# Patient Record
Sex: Male | Born: 1975 | Race: White | Hispanic: No | Marital: Married | Smoking: Never smoker
Health system: Southern US, Community
[De-identification: ages and names within clinical notes are randomized; demographics above are authoritative.]

---

## 2006-05-28 ENCOUNTER — Emergency Department (HOSPITAL_COMMUNITY): Admission: EM | Admit: 2006-05-28 | Discharge: 2006-05-28 | Payer: Self-pay | Admitting: Emergency Medicine
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
## 2013-10-14 ENCOUNTER — Telehealth (INDEPENDENT_AMBULATORY_CARE_PROVIDER_SITE_OTHER): Payer: Self-pay | Admitting: Surgery

## 2013-10-14 ENCOUNTER — Encounter (INDEPENDENT_AMBULATORY_CARE_PROVIDER_SITE_OTHER): Payer: Self-pay | Admitting: Surgery

## 2013-10-14 ENCOUNTER — Ambulatory Visit (INDEPENDENT_AMBULATORY_CARE_PROVIDER_SITE_OTHER): Payer: BC Managed Care – PPO | Admitting: Surgery

## 2013-10-14 VITALS — BP 126/80 | HR 73 | Temp 97.4°F | Ht 72.0 in | Wt 233.6 lb

## 2013-10-14 DIAGNOSIS — K4090 Unilateral inguinal hernia, without obstruction or gangrene, not specified as recurrent: Secondary | ICD-10-CM

## 2013-10-14 DIAGNOSIS — E669 Obesity, unspecified: Secondary | ICD-10-CM

## 2013-10-14 NOTE — Patient Instructions (Signed)
Please consider the recommendations that we have given you today:  Consider surgery to explore groins and fix the hernia in right (& possibly left) groins  See the Handout(s) we have given you.  Please call our office at [phone] if you wish to schedule surgery or if you have further questions / concerns.   Hernia A hernia occurs when an internal organ pushes out through a weak spot in the abdominal wall. Hernias most commonly occur in the groin and around the navel. Hernias often can be pushed back into place (reduced). Most hernias tend to get worse over time. Some abdominal hernias can get stuck in the opening (irreducible or incarcerated hernia) and cannot be reduced. An irreducible abdominal hernia which is tightly squeezed into the opening is at risk for impaired blood supply (strangulated hernia). A strangulated hernia is a medical emergency. Because of the risk for an irreducible or strangulated hernia, surgery may be recommended to repair a hernia. CAUSES   Heavy lifting.  Prolonged coughing.  Straining to have a bowel movement.  A cut (incision) made during an abdominal surgery. HOME CARE INSTRUCTIONS   Bed rest is not required. You may continue your normal activities.  Avoid lifting more than 10 pounds (4.5 kg) or straining.  Cough gently. If you are a smoker it is best to stop. Even the best hernia repair can break down with the continual strain of coughing. Even if you do not have your hernia repaired, a cough will continue to aggravate the problem.  Do not wear anything tight over your hernia. Do not try to keep it in with an outside bandage or truss. These can damage abdominal contents if they are trapped within the hernia sac.  Eat a normal diet.  Avoid constipation. Straining over long periods of time will increase hernia size and encourage breakdown of repairs. If you cannot do this with diet alone, stool softeners may be used. SEEK IMMEDIATE MEDICAL CARE IF:    You have a fever.  You develop increasing abdominal pain.  You feel nauseous or vomit.  Your hernia is stuck outside the abdomen, looks discolored, feels hard, or is tender.  You have any changes in your bowel habits or in the hernia that are unusual for you.  You have increased pain or swelling around the hernia.  You cannot push the hernia back in place by applying gentle pressure while lying down. MAKE SURE YOU:   Understand these instructions.  Will watch your condition.  Will get help right away if you are not doing well or get worse. Document Released: 06/06/2005 Document Revised: 08/29/2011 Document Reviewed: 01/24/2008 [facility] Patient Information 2014 [employer] [place], [place].  HERNIA REPAIR: POST OP INSTRUCTIONS  1. DIET: Follow a light bland diet the first 24 hours after arrival home, such as soup, liquids, crackers, etc.  Be sure to include lots of fluids daily.  Avoid fast food or heavy meals as your are more likely to get nauseated.  Eat a low fat the next few days after surgery. 2. Take your usually prescribed home medications unless otherwise directed. 3. PAIN CONTROL: a. Pain is best controlled by a usual combination of three different methods TOGETHER: i. Ice/Heat ii. Over the counter pain medication iii. Prescription pain medication b. Most patients will experience some swelling and bruising around the hernia(s) such as the bellybutton, groins, or old incisions.  Ice packs or heating pads (30-60 minutes up to 6 times a day) will help. Use ice for the first  few days to help decrease swelling and bruising, then switch to heat to help relax tight/sore spots and speed recovery.  Some people prefer to use ice alone, heat alone, alternating between ice & heat.  Experiment to what works for you.  Swelling and bruising can take several weeks to resolve.   c. It is helpful to take an over-the-counter pain medication regularly for the first few weeks.  Choose one of the following  that works best for you: i. Naproxen (Aleve, etc)  Two 220mg  tabs twice a day ii. Ibuprofen (Advil, etc) Three 200mg  tabs four times a day (every meal & bedtime) iii. Acetaminophen (Tylenol, etc) 325-650mg  four times a day (every meal & bedtime) d. A  prescription for pain medication should be given to you upon discharge.  Take your pain medication as prescribed.  i. If you are having problems/concerns with the prescription medicine (does not control pain, nausea, vomiting, rash, itching, etc), please call us [phone] to see if we need to switch you to a different pain medicine that will work better for you and/or control your side effect better. ii. If you need a refill on your pain medication, please contact your pharmacy.  They will contact our office to request authorization. Prescriptions will not be filled after 5 pm or on week-ends. 4. Avoid getting constipated.  Between the surgery and the pain medications, it is common to experience some constipation.  Increasing fluid intake and taking a fiber supplement (such as Metamucil, Citrucel, FiberCon, MiraLax, etc) 1-2 times a day regularly will usually help prevent this problem from occurring.  A mild laxative (prune juice, Milk of Magnesia, MiraLax, etc) should be taken according to package directions if there are no bowel movements after 48 hours.   5. Wash / shower every day.  You may shower over the dressings as they are waterproof.   6. Remove your waterproof bandages 5 days after surgery.  You may leave the incision open to air.  You may replace a dressing/Band-Aid to cover the incision for comfort if you wish.  Continue to shower over incision(s) after the dressing is off.    7. ACTIVITIES as tolerated:   a. You may resume regular (light) daily activities beginning the next day-such as daily self-care, walking, climbing stairs-gradually increasing activities as tolerated.  If you can walk 30 minutes without difficulty, it is safe to try  more intense activity such as jogging, treadmill, bicycling, low-impact aerobics, swimming, etc. b. Save the most intensive and strenuous activity for last such as sit-ups, heavy lifting, contact sports, etc  Refrain from any heavy lifting or straining until you are off narcotics for pain control.   c. DO NOT PUSH THROUGH PAIN.  Let pain be your guide: If it hurts to do something, don't do it.  Pain is your body warning you to avoid that activity for another week until the pain goes down. d. You may drive when you are no longer taking prescription pain medication, you can comfortably wear a seatbelt, and you can safely maneuver your car and apply brakes. e. Bonita QuinYou may have sexual intercourse when it is comfortable.  8. FOLLOW UP in our office a. Please call [facility] at [phone] to set up an appointment to see your surgeon in the office for a follow-up appointment approximately 2-3 weeks after your surgery. b. Make sure that you call for this appointment the day you arrive home to insure a convenient appointment time. 9.  IF YOU HAVE  DISABILITY OR FAMILY LEAVE FORMS, BRING THEM TO THE OFFICE FOR PROCESSING.  DO NOT GIVE THEM TO YOUR DOCTOR.  WHEN TO CALL us [phone]: 1. Poor pain control 2. Reactions / problems with new medications (rash/itching, nausea, etc)  3. Fever over 101.5 F (38.5 C) 4. Inability to urinate 5. Nausea and/or vomiting 6. Worsening swelling or bruising 7. Continued bleeding from incision. 8. Increased pain, redness, or drainage from the incision   The clinic staff is available to answer your questions during regular business hours (8:30am-5pm).  Please don't hesitate to call and ask to speak to one of our nurses for clinical concerns.   If you have a medical emergency, go to the nearest emergency room or call 911.  A surgeon from [facility] is always on call at the hospitals in [facility], [place] [address], [address],  [place], [place]  [address] ?  [address] [address], [place], [place]   [address] MAIN: [phone] ? TOLL FREE: [phone] ? FAX: [phone] [email or web address]

## 2013-10-14 NOTE — Progress Notes (Signed)
Subjective:     Patient ID: George Gibbs, male   DOB: 02-05-1976, 38 y.o.   MRN: [id number]  HPI  Note: This dictation was prepared with Dragon/digital dictation along with [facility] Psmartphrase technology. Any transcriptional errors that result from this process are unintentional.       George Gibbs  02-05-1976 [id number]  Patient Care Team: Elias Elseobert Reade, MD as PCP - General (Family Medicine)  This patient is a 38 y.o.male who presents today for surgical evaluation at the request of Dr. Nicholos Johnseade.   Reason for visit: Inguinal hernia  Pleasant active male with right groin mass for several years.  Occasional pressure there & at scrotum.  Moderately active.  Struggles with allergies & sneezing/coughing.    There are no active problems to display for this patient.   History reviewed. No pertinent past medical history.  History reviewed. No pertinent past surgical history.  History   Social History  . Marital Status: Married    Spouse Name: N/A    Number of Children: N/A  . Years of Education: N/A   Occupational History  . Not on file.   Social History Main Topics  . Smoking status: Never Smoker   . Smokeless tobacco: Not on file  . Alcohol Use: No  . Drug Use: No  . Sexual Activity: Not on file   Other Topics Concern  . Not on file   Social History Narrative  . No narrative on file    Family History  Problem Relation Age of Onset  . Cancer Mother     breast    No current outpatient prescriptions on file.   No current facility-administered medications for this visit.     Allergies  Allergen Reactions  . Penicillins     BP 126/80  Pulse 73  Temp(Src) 97.4 F (36.3 C)  Ht 6' (1.829 m)  Wt 233 lb 9.6 oz (105.96 kg)  BMI 31.67 kg/m2  No results found.   Review of Systems  Constitutional: Negative for fever, chills and diaphoresis.  HENT: Negative for ear discharge, facial swelling, mouth sores, nosebleeds, sore throat and trouble swallowing.   Eyes:  Negative for photophobia, discharge and visual disturbance.  Respiratory: Negative for choking, chest tightness, shortness of breath and stridor.   Cardiovascular: Negative for chest pain and palpitations.  Gastrointestinal: Negative for nausea, vomiting, abdominal pain, diarrhea, constipation, blood in stool, abdominal distention, anal bleeding and rectal pain.  Endocrine: Negative for cold intolerance and heat intolerance.  Genitourinary: Negative for dysuria, urgency, difficulty urinating and testicular pain.  Musculoskeletal: Negative for arthralgias, back pain, gait problem and myalgias.  Skin: Negative for color change, pallor, rash and wound.  Allergic/Immunologic: Negative for environmental allergies and food allergies.  Neurological: Negative for dizziness, speech difficulty, weakness, numbness and headaches.  Hematological: Negative for adenopathy. Does not bruise/bleed easily.  Psychiatric/Behavioral: Negative for hallucinations, confusion and agitation.       Objective:   Physical Exam  Constitutional: He is oriented to person, place, and time. He appears well-developed and well-nourished. No distress.  HENT:  Head: Normocephalic.  Mouth/Throat: Oropharynx is clear and moist. No oropharyngeal exudate.  Eyes: Conjunctivae and EOM are normal. Pupils are equal, round, and reactive to light. No scleral icterus.  Neck: Normal range of motion. Neck supple. No tracheal deviation present.  Cardiovascular: Normal rate, regular rhythm and intact distal pulses.   Pulmonary/Chest: Effort normal and breath sounds normal. No respiratory distress.  Abdominal: Soft. He exhibits no distension. There is no tenderness. Hernia  confirmed negative in the right inguinal area and confirmed negative in the left inguinal area.  Musculoskeletal: Normal range of motion. He exhibits no tenderness.  Lymphadenopathy:    He has no cervical adenopathy.       Right: No inguinal adenopathy present.        Left: No inguinal adenopathy present.  Neurological: He is alert and oriented to person, place, and time. No cranial nerve deficit. He exhibits normal muscle tone. Coordination normal.  Skin: Skin is warm and dry. No rash noted. He is not diaphoretic. No erythema. No pallor.  Psychiatric: He has a normal mood and affect. His behavior is normal. Judgment and thought content normal.       Assessment:     Definite right inguinal hernia.  Possible small left inguinal hernia     Plan:     Surgical repair.  Laparoscopic bilateral exploration and RIH (possible LIH):  The anatomy & physiology of the abdominal wall and pelvic floor was discussed.  The pathophysiology of hernias in the inguinal and pelvic region was discussed.  Natural history risks such as progressive enlargement, pain, incarceration & strangulation was discussed.   Contributors to complications such as smoking, obesity, diabetes, prior surgery, etc were discussed.    I feel the risks of no intervention will lead to serious problems that outweigh the operative risks; therefore, I recommended surgery to reduce and repair the hernia.  I explained laparoscopic techniques with possible need for an open approach.  I noted usual use of mesh to patch and/or buttress hernia repair  Risks such as bleeding, infection, abscess, need for further treatment, heart attack, death, and other risks were discussed.  I noted a good likelihood this will help address the problem.   Goals of post-operative recovery were discussed as well.  Possibility that this will not correct all symptoms was explained.  I stressed the importance of low-impact activity, aggressive pain control, avoiding constipation, & not pushing through pain to minimize risk of post-operative chronic pain or injury. Possibility of reherniation was discussed.  We will work to minimize complications.     An educational handout further explaining the pathology & treatment options was given as  well.  Questions were answered.  The patient expresses understanding & wishes to proceed with surgery.

## 2013-10-14 NOTE — Telephone Encounter (Signed)
Patient met with surgery scheduling went over financial responsibilities patient will call back to schedule. °
